# Patient Record
Sex: Male | Born: 1975 | Race: Black or African American | Hispanic: No | Marital: Married | Smoking: Former smoker
Health system: Southern US, Community
[De-identification: ages and names within clinical notes are randomized; demographics above are authoritative.]

## PROBLEM LIST (undated history)

## (undated) DIAGNOSIS — E7800 Pure hypercholesterolemia, unspecified: Secondary | ICD-10-CM

## (undated) HISTORY — PX: PATELLAR TENDON REPAIR: SHX737

---

## 2019-02-10 ENCOUNTER — Emergency Department (HOSPITAL_COMMUNITY): Payer: PRIVATE HEALTH INSURANCE

## 2019-02-10 ENCOUNTER — Encounter (HOSPITAL_COMMUNITY): Payer: Self-pay | Admitting: Emergency Medicine

## 2019-02-10 ENCOUNTER — Emergency Department (HOSPITAL_COMMUNITY)
Admission: EM | Admit: 2019-02-10 | Discharge: 2019-02-11 | Disposition: A | Discharge status: Home / Self Care | Payer: PRIVATE HEALTH INSURANCE | Attending: Emergency Medicine | Admitting: Emergency Medicine

## 2019-02-10 ENCOUNTER — Other Ambulatory Visit: Payer: Self-pay

## 2019-02-10 DIAGNOSIS — Y92149 Unspecified place in prison as the place of occurrence of the external cause: Secondary | ICD-10-CM | POA: Diagnosis not present

## 2019-02-10 DIAGNOSIS — S161XXA Strain of muscle, fascia and tendon at neck level, initial encounter: Secondary | ICD-10-CM

## 2019-02-10 DIAGNOSIS — Y9389 Activity, other specified: Secondary | ICD-10-CM | POA: Diagnosis not present

## 2019-02-10 DIAGNOSIS — S39012A Strain of muscle, fascia and tendon of lower back, initial encounter: Secondary | ICD-10-CM

## 2019-02-10 DIAGNOSIS — Y998 Other external cause status: Secondary | ICD-10-CM | POA: Diagnosis not present

## 2019-02-10 DIAGNOSIS — W01198A Fall on same level from slipping, tripping and stumbling with subsequent striking against other object, initial encounter: Secondary | ICD-10-CM | POA: Diagnosis not present

## 2019-02-10 DIAGNOSIS — S3992XA Unspecified injury of lower back, initial encounter: Secondary | ICD-10-CM | POA: Diagnosis present

## 2019-02-10 HISTORY — DX: Pure hypercholesterolemia, unspecified: E78.00

## 2019-02-10 MED ORDER — ONDANSETRON 8 MG PO TBDP
8.0000 mg | ORAL_TABLET | Freq: Once | ORAL | Status: AC
  Administered 2019-02-11: 8 mg via ORAL
  Filled 2019-02-10: qty 1

## 2019-02-10 MED ORDER — HYDROMORPHONE HCL 1 MG/ML IJ SOLN
1.0000 mg | Freq: Once | INTRAMUSCULAR | Status: AC
  Administered 2019-02-11: 1 mg via INTRAMUSCULAR
  Filled 2019-02-10: qty 1

## 2019-02-10 MED ORDER — KETOROLAC TROMETHAMINE 60 MG/2ML IM SOLN
60.0000 mg | Freq: Once | INTRAMUSCULAR | Status: AC
  Administered 2019-02-11: 60 mg via INTRAMUSCULAR
  Filled 2019-02-10: qty 2

## 2019-02-10 NOTE — ED Provider Notes (Signed)
[facility] EMERGENCY DEPARTMENT Provider Note   CSN: [id number] Arrival date & time: 02/10/19  2344    History   Chief Complaint Chief Complaint  Patient presents with  . Back Pain    HPI Ernest Nunez is a 43 y.o. male.     Patient presents to the emergency department for evaluation after a fall.  Patient reports that he was walking and thinks he slipped on water, fell backwards.  He landed in a seated position.  He was unable to get back up because he immediately had back pain.  Patient reports severe pain in the lower back as well as at the base of the neck.  He did not lose consciousness or hit his head.  No headache.  No vision change.  No numbness, tingling or weakness in extremities.  Pain does not radiate to extremities.  Low back pain does worsen if he moves his legs, however.     Past Medical History:  Diagnosis Date  . Hypercholesteremia     There are no active problems to display for this patient.   Past Surgical History:  Procedure Laterality Date  . PATELLAR TENDON REPAIR          Home Medications    Prior to Admission medications   Medication Sig Start Date End Date Taking? Authorizing Provider  ibuprofen (ADVIL) 800 MG tablet Take 1 tablet (800 mg total) by mouth every 6 (six) hours as needed for moderate pain. 02/11/19   Gilda CreasePollina, Jabril Pursell J, MD    Family History No family history on file.  Social History Social History   Tobacco Use  . Smoking status: Former Games developermoker  . Smokeless tobacco: Never Used  Substance Use Topics  . Alcohol use: Not Currently  . Drug use: Not Currently     Allergies   Patient has no allergy information on record.   Review of Systems Review of Systems  Musculoskeletal: Positive for back pain.  All other systems reviewed and are negative.    Physical Exam Updated Vital Signs Pulse 75   Temp 98.2 F (36.8 C) (Oral)   Resp 18   Ht 6' (1.829 m)   Wt 127 kg   SpO2 98%   BMI 37.97 kg/m   Physical  Exam Vitals signs and nursing note reviewed.  Constitutional:      General: He is not in acute distress.    Appearance: Normal appearance. He is well-developed.  HENT:     Head: Normocephalic and atraumatic.     Right Ear: Hearing normal.     Left Ear: Hearing normal.     Nose: Nose normal.  Eyes:     Conjunctiva/sclera: Conjunctivae normal.     Pupils: Pupils are equal, round, and reactive to light.  Neck:     Musculoskeletal: Normal range of motion and neck supple.  Cardiovascular:     Rate and Rhythm: Regular rhythm.     Heart sounds: S1 normal and S2 normal. No murmur. No friction rub. No gallop.   Pulmonary:     Effort: Pulmonary effort is normal. No respiratory distress.     Breath sounds: Normal breath sounds.  Chest:     Chest wall: No tenderness.  Abdominal:     General: Bowel sounds are normal.     Palpations: Abdomen is soft.     Tenderness: There is no abdominal tenderness. There is no guarding or rebound. Negative signs include Murphy's sign and McBurney's sign.     Hernia: No hernia  is present.  Musculoskeletal: Normal range of motion.     Cervical back: He exhibits tenderness.     Lumbar back: He exhibits tenderness.       Back:  Skin:    General: Skin is warm and dry.     Findings: No rash.  Neurological:     Mental Status: He is alert and oriented to person, place, and time.     GCS: GCS eye subscore is 4. GCS verbal subscore is 5. GCS motor subscore is 6.     Cranial Nerves: No cranial nerve deficit.     Sensory: No sensory deficit.     Coordination: Coordination normal.     Comments: Normal sensation bilateral lower extremities  Painful inhibition with lifting both legs against gravity  Pain with passive raising legs bilaterally but does not radiate to legs, this reproduces low back pain  Psychiatric:        Speech: Speech normal.        Behavior: Behavior normal.        Thought Content: Thought content normal.      ED Treatments / Results   Labs (all labs ordered are listed, but only abnormal results are displayed) Labs Reviewed - No data to display  EKG None  Radiology Dg Thoracic Spine 2 View  Result Date: 02/11/2019 CLINICAL DATA:  Fall with back pain EXAM: THORACIC SPINE 2 VIEWS COMPARISON:  None. FINDINGS: There is no evidence of thoracic spine fracture. Alignment is normal. No other significant bone abnormalities are identified. IMPRESSION: Negative. Electronically Signed   By: Donavan Foil M.D.   On: 02/11/2019 00:46   Dg Lumbar Spine Complete  Result Date: 02/11/2019 CLINICAL DATA:  Fall with back pain EXAM: LUMBAR SPINE - COMPLETE 4+ VIEW COMPARISON:  None. FINDINGS: There is no evidence of lumbar spine fracture. Alignment is normal. Intervertebral disc spaces are maintained. IMPRESSION: Negative. Electronically Signed   By: Donavan Foil M.D.   On: 02/11/2019 00:47   Ct Cervical Spine Wo Contrast  Result Date: 02/11/2019 CLINICAL DATA:  Fall, neck pain EXAM: CT CERVICAL SPINE WITHOUT CONTRAST TECHNIQUE: Multidetector CT imaging of the cervical spine was performed without intravenous contrast. Multiplanar CT image reconstructions were also generated. COMPARISON:  None. FINDINGS: Alignment: Normal Skull base and vertebrae: No acute fracture. No primary bone lesion or focal pathologic process. Soft tissues and spinal canal: No prevertebral fluid or swelling. No visible canal hematoma. Disc levels:  Normal Upper chest: Negative Other: None IMPRESSION: No acute bony abnormality in the cervical spine. Electronically Signed   By: Rolm Baptise M.D.   On: 02/11/2019 00:37    Procedures Procedures (including critical care time)  Medications Ordered in ED Medications  ketorolac (TORADOL) injection 60 mg (60 mg Intramuscular Given 02/11/19 0003)  HYDROmorphone (DILAUDID) injection 1 mg (1 mg Intramuscular Given 02/11/19 0003)  ondansetron (ZOFRAN-ODT) disintegrating tablet 8 mg (8 mg Oral Given 02/11/19 0003)     Initial  Impression / Assessment and Plan / ED Course  I have reviewed the triage vital signs and the nursing notes.  Pertinent labs & imaging results that were available during my care of the patient were reviewed by me and considered in my medical decision making (see chart for details).        Patient presents to the emergency department for evaluation of back injury.  Patient reports slipping on water while walking and falling backwards, landing on his lower back.  Patient complaining of neck and back pain after  the fall.  He has tenderness at the base of the neck as well as lower back.  Back pain worsens with flexing at the hips but he does not have radicular pain.  He has normal strength and sensation.  No foot drop.  No saddle anesthesia.  No specific warning signs of cauda equina syndrome or serious back injury.  X-ray of thoracic and lumbar spine negative.  CT cervical spine negative.  Patient treated with analgesia here in the ER and will be discharged.  Final Clinical Impressions(s) / ED Diagnoses   Final diagnoses:  Strain of lumbar region, initial encounter  Cervical strain, acute, initial encounter    ED Discharge Orders         Ordered    ibuprofen (ADVIL) 800 MG tablet  Every 6 hours PRN     02/11/19 0052           Gilda CreasePollina, Montzerrat Brunell J, MD 02/11/19 [phone]

## 2019-02-10 NOTE — ED Triage Notes (Signed)
Pt from [facility]. Per EMS pt was walking down the hall and fell backwards hitting his lower back. Pt denies loss of sensation in feet and legs. Pt able to move all extremities.

## 2019-02-11 ENCOUNTER — Emergency Department (HOSPITAL_COMMUNITY): Payer: PRIVATE HEALTH INSURANCE

## 2019-02-11 DIAGNOSIS — S39012A Strain of muscle, fascia and tendon of lower back, initial encounter: Secondary | ICD-10-CM | POA: Diagnosis not present

## 2019-02-11 MED ORDER — IBUPROFEN 800 MG PO TABS: 800.0000 mg | ORAL_TABLET | Freq: Four times a day (QID) | ORAL | 0 refills | Status: AC | PRN

## 2019-02-11 NOTE — ED Notes (Signed)
Patient transported to CT 

## 2019-02-11 NOTE — ED Notes (Signed)
Pt returned from radiology.

## 2020-08-10 IMAGING — DX THORACIC SPINE 2 VIEWS
3 series · 3 of 3 positions shown · non-contrast
Comparison: None.

CLINICAL DATA: Fall with back pain

EXAM:
THORACIC SPINE 2 VIEWS

[t-spine ap]
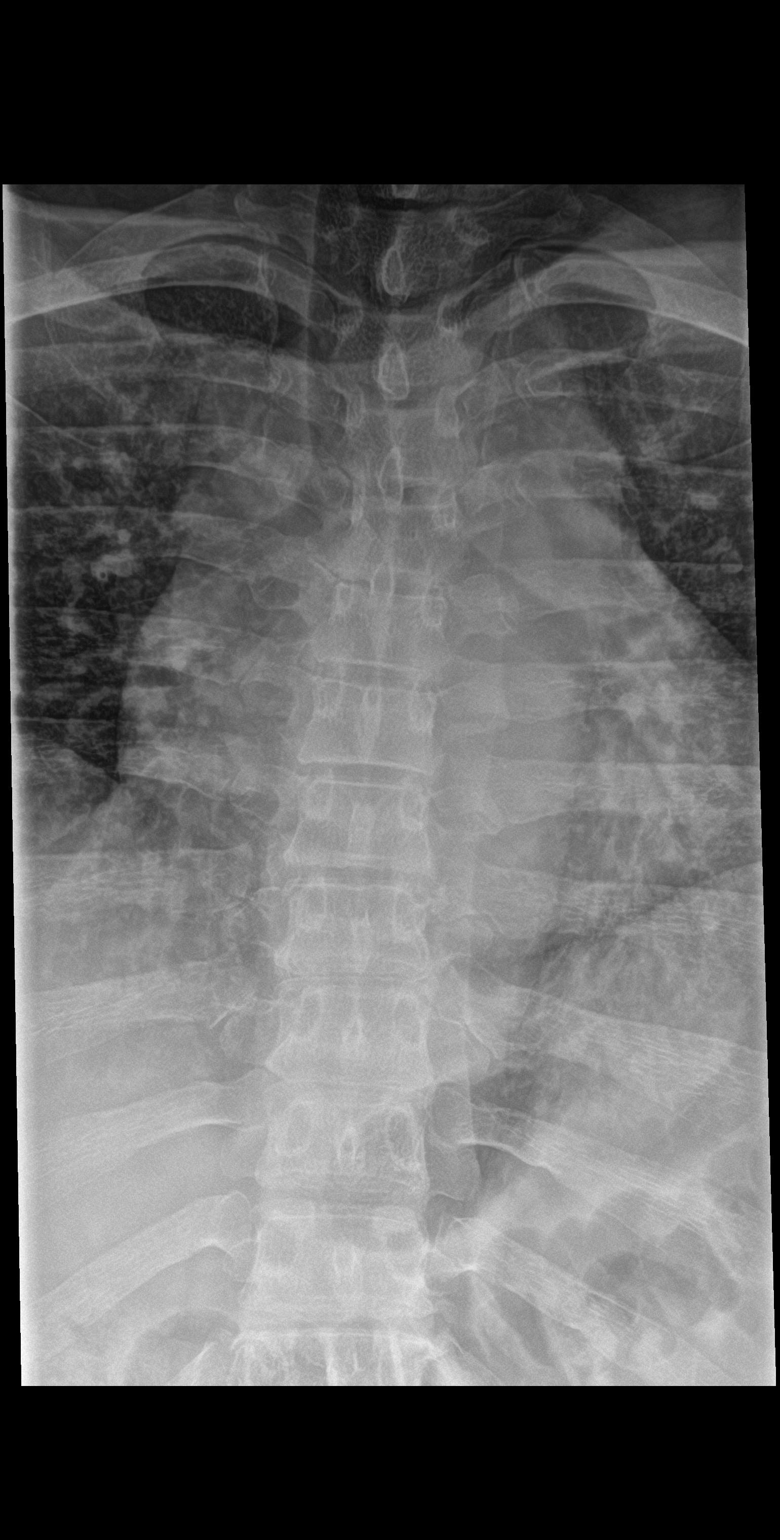

[t-spine lat (1 of 2)]
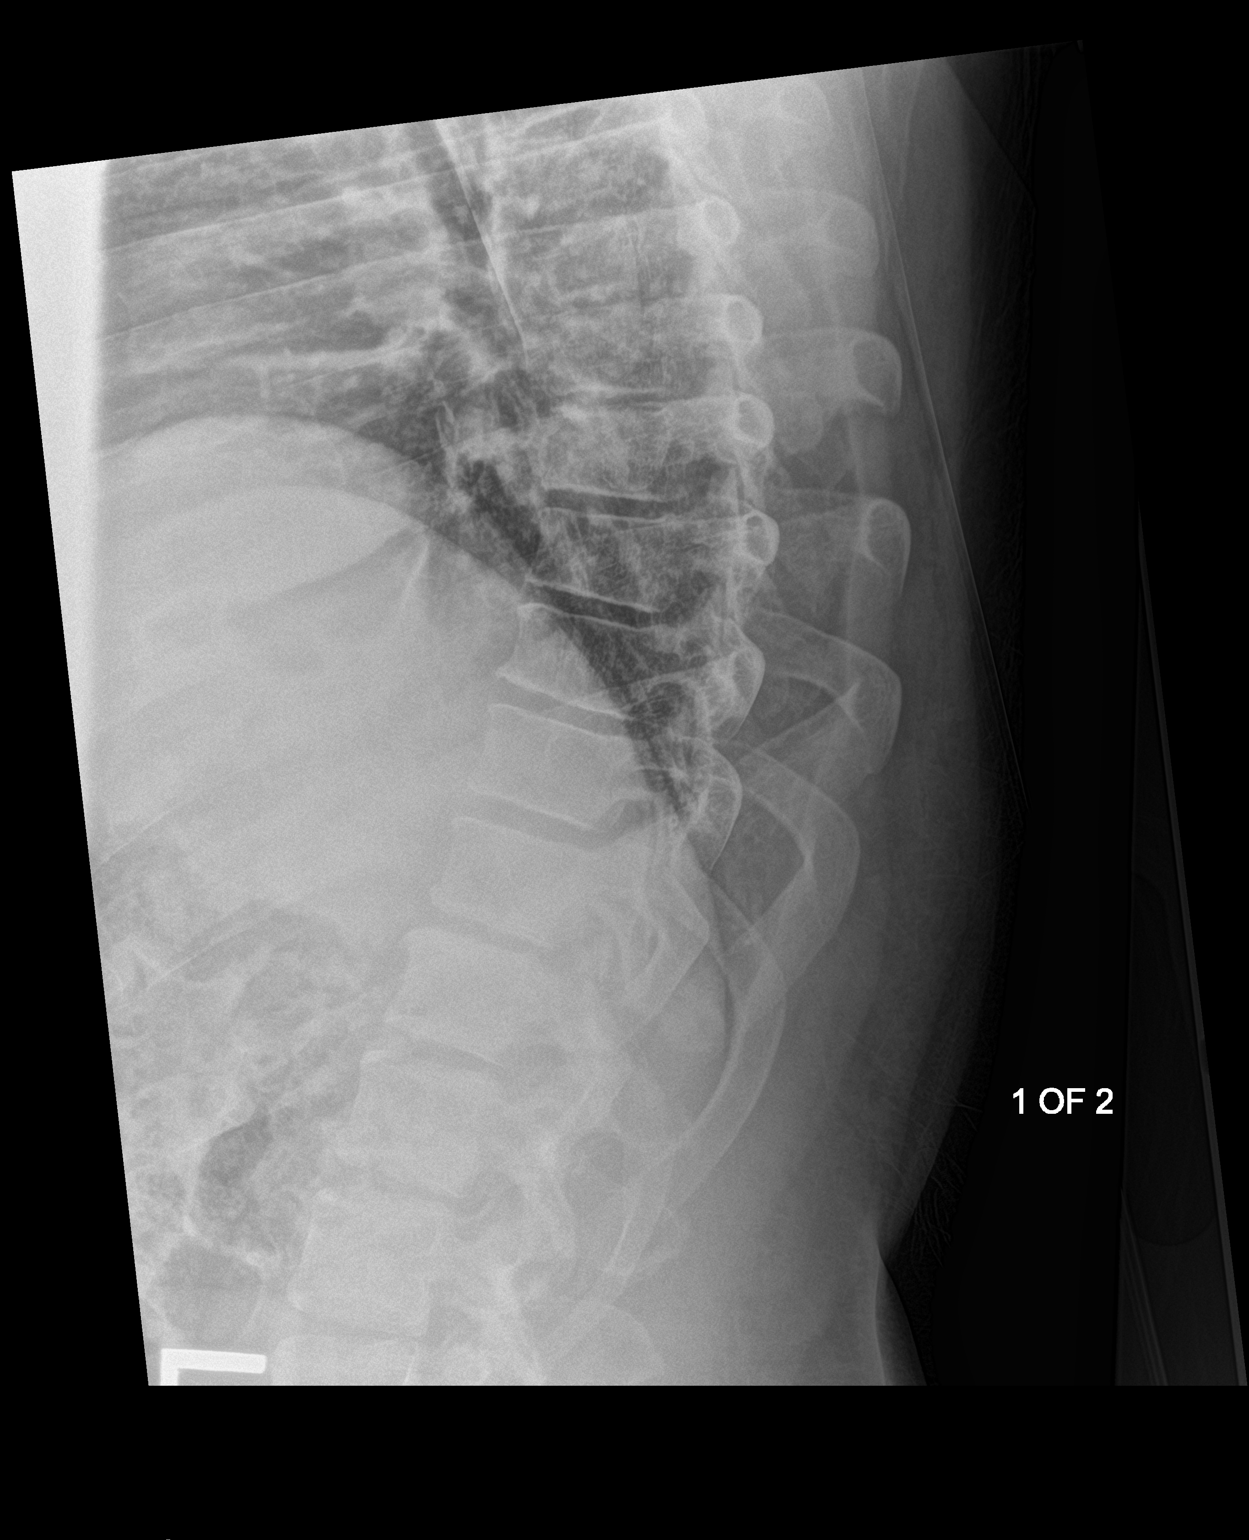

[t-spine lat (2 of 2)]
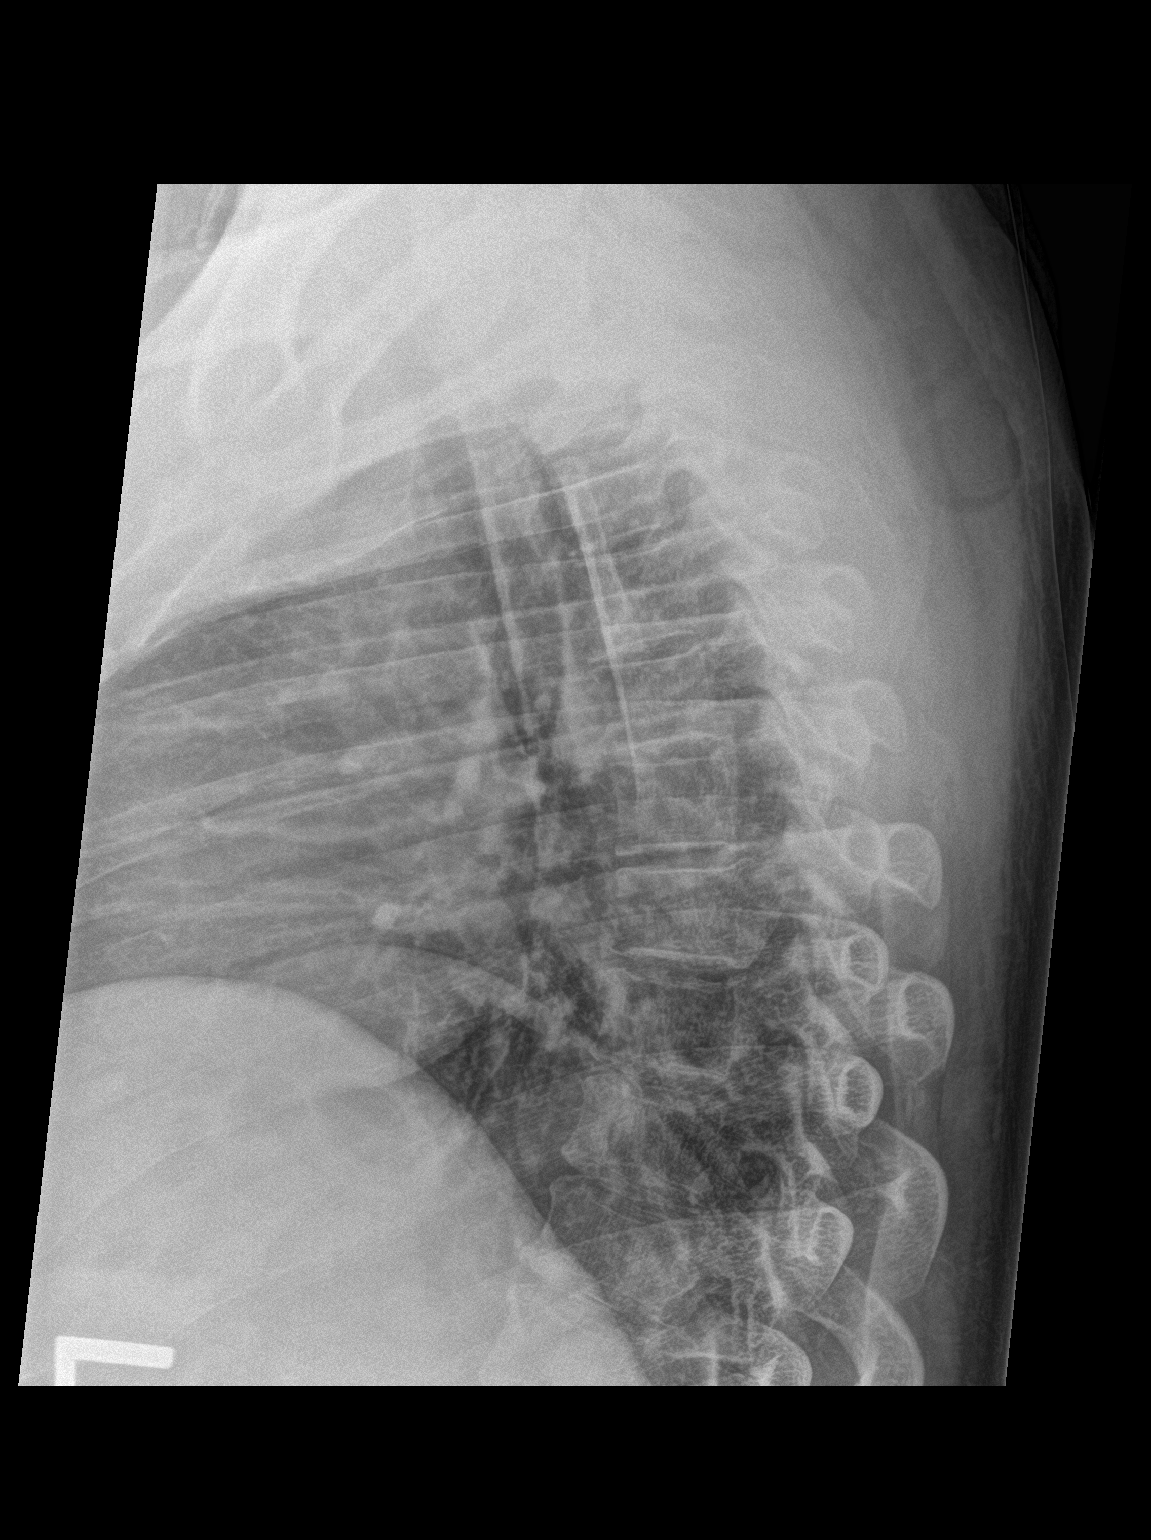

[3 of 3 positions shown; findings below may reference images not displayed]

FINDINGS: There is no evidence of thoracic spine fracture. Alignment is
normal. No other significant bone abnormalities are identified.
IMPRESSION: Negative.
